# Patient Record
Sex: Male | Born: 1960 | Race: White | Hispanic: No | Marital: Married | Smoking: Never smoker
Health system: Southern US, Community
[De-identification: ages and names within clinical notes are randomized; demographics above are authoritative.]

## PROBLEM LIST (undated history)

## (undated) DIAGNOSIS — E119 Type 2 diabetes mellitus without complications: Secondary | ICD-10-CM

## (undated) DIAGNOSIS — I10 Essential (primary) hypertension: Secondary | ICD-10-CM

---

## 2018-09-10 ENCOUNTER — Other Ambulatory Visit: Payer: Self-pay

## 2018-09-10 ENCOUNTER — Encounter (HOSPITAL_BASED_OUTPATIENT_CLINIC_OR_DEPARTMENT_OTHER): Payer: Self-pay | Admitting: Emergency Medicine

## 2018-09-10 ENCOUNTER — Emergency Department (HOSPITAL_BASED_OUTPATIENT_CLINIC_OR_DEPARTMENT_OTHER): Payer: 59

## 2018-09-10 ENCOUNTER — Emergency Department (HOSPITAL_BASED_OUTPATIENT_CLINIC_OR_DEPARTMENT_OTHER)
Admission: EM | Admit: 2018-09-10 | Discharge: 2018-09-10 | Disposition: A | Discharge status: Other Acute Inpatient Hospital | Payer: 59 | Attending: Emergency Medicine | Admitting: Emergency Medicine

## 2018-09-10 DIAGNOSIS — I10 Essential (primary) hypertension: Secondary | ICD-10-CM | POA: Diagnosis not present

## 2018-09-10 DIAGNOSIS — R079 Chest pain, unspecified: Secondary | ICD-10-CM

## 2018-09-10 DIAGNOSIS — R0789 Other chest pain: Secondary | ICD-10-CM | POA: Diagnosis not present

## 2018-09-10 DIAGNOSIS — E119 Type 2 diabetes mellitus without complications: Secondary | ICD-10-CM | POA: Diagnosis not present

## 2018-09-10 HISTORY — DX: Type 2 diabetes mellitus without complications: E11.9

## 2018-09-10 HISTORY — DX: Essential (primary) hypertension: I10

## 2018-09-10 LAB — CBC WITH DIFFERENTIAL/PLATELET
ABS IMMATURE GRANULOCYTES: 0.02 10*3/uL (ref 0.00–0.07)
Basophils Absolute: 0 10*3/uL (ref 0.0–0.1)
Basophils Relative: 1 %
Eosinophils Absolute: 0.1 10*3/uL (ref 0.0–0.5)
Eosinophils Relative: 2 %
HEMATOCRIT: 43.1 % (ref 39.0–52.0)
HEMOGLOBIN: 14.7 g/dL (ref 13.0–17.0)
Immature Granulocytes: 0 %
Lymphocytes Relative: 26 %
Lymphs Abs: 1.6 10*3/uL (ref 0.7–4.0)
MCH: 31.5 pg (ref 26.0–34.0)
MCHC: 34.1 g/dL (ref 30.0–36.0)
MCV: 92.3 fL (ref 80.0–100.0)
Monocytes Absolute: 0.8 10*3/uL (ref 0.1–1.0)
Monocytes Relative: 13 %
NEUTROS ABS: 3.6 10*3/uL (ref 1.7–7.7)
Neutrophils Relative %: 58 %
Platelets: 233 10*3/uL (ref 150–400)
RBC: 4.67 MIL/uL (ref 4.22–5.81)
RDW: 11.5 % (ref 11.5–15.5)
WBC: 6.2 10*3/uL (ref 4.0–10.5)
nRBC: 0 % (ref 0.0–0.2)

## 2018-09-10 LAB — BASIC METABOLIC PANEL
Anion gap: 9 (ref 5–15)
BUN: 13 mg/dL (ref 6–20)
CO2: 24 mmol/L (ref 22–32)
Calcium: 9.5 mg/dL (ref 8.9–10.3)
Chloride: 103 mmol/L (ref 98–111)
Creatinine, Ser: 0.61 mg/dL (ref 0.61–1.24)
GFR calc Af Amer: >60 mL/min (ref >60)
GFR calc non Af Amer: >60 mL/min (ref >60)
Glucose, Bld: 161 mg/dL — ABNORMAL HIGH (ref 70–99)
POTASSIUM: 4.5 mmol/L (ref 3.5–5.1)
Sodium: 136 mmol/L (ref 135–145)

## 2018-09-10 LAB — TROPONIN I
Troponin I: <0.03 ng/mL (ref <0.03)
Troponin I: <0.03 ng/mL (ref <0.03)

## 2018-09-10 MED ORDER — ONDANSETRON HCL 4 MG/2ML IJ SOLN
INTRAMUSCULAR | Status: AC
  Filled 2018-09-10: qty 2

## 2018-09-10 MED ORDER — ONDANSETRON HCL 4 MG/2ML IJ SOLN
4.0000 mg | Freq: Once | INTRAMUSCULAR | Status: AC
  Administered 2018-09-10: 4 mg via INTRAVENOUS

## 2018-09-10 MED ORDER — ASPIRIN 81 MG PO CHEW
324.0000 mg | CHEWABLE_TABLET | Freq: Once | ORAL | Status: AC
  Administered 2018-09-10: 324 mg via ORAL
  Filled 2018-09-10: qty 4

## 2018-09-10 MED ORDER — HYDROMORPHONE HCL 1 MG/ML IJ SOLN
0.5000 mg | Freq: Once | INTRAMUSCULAR | Status: AC
  Administered 2018-09-10: 0.5 mg via INTRAVENOUS
  Filled 2018-09-10: qty 1

## 2018-09-10 NOTE — ED Provider Notes (Signed)
[facility] Emergency Department Provider Note MRN:  [id number]  Arrival date & time: 09/10/18     Chief Complaint   Chest Pain   History of Present Illness   Jonathon Jackson is a 58 y.o. year-old male with a history of hypertension, diabetes presenting to the ED with chief complaint of chest pain.  Left-sided chest pain that began suddenly at 4 AM, waking him from sleep.  The pain is nonradiating, the pain is 6 out of 10 in severity, described as a pressure-like pain.  Associated with paresthesias to the left hand, lightheadedness.  Denies nausea or vomiting, no shortness of breath.  Pain constant since that time.  Denies tobacco use.  Sister died of a heart attack at age 83.  Review of Systems  A complete 10 system review of systems was obtained and all systems are negative except as noted in the HPI and PMH.   Patient's Health History    Past Medical History:  Diagnosis Date  . Diabetes mellitus without complication (HCC)   . Hypertension     History reviewed. No pertinent surgical history.  History reviewed. No pertinent family history.  Social History   Socioeconomic History  . Marital status: Married    Spouse name: Not on file  . Number of children: Not on file  . Years of education: Not on file  . Highest education level: Not on file  Occupational History  . Not on file  Social Needs  . Financial resource strain: Not on file  . Food insecurity:    Worry: Not on file    Inability: Not on file  . Transportation needs:    Medical: Not on file    Non-medical: Not on file  Tobacco Use  . Smoking status: Never Smoker  . Smokeless tobacco: Never Used  Substance and Sexual Activity  . Alcohol use: Yes  . Drug use: Not Currently  . Sexual activity: Not on file  Lifestyle  . Physical activity:    Days per week: Not on file    Minutes per session: Not on file  . Stress: Not on file  Relationships  . Social connections:    Talks on  phone: Not on file    Gets together: Not on file    Attends religious service: Not on file    Active member of club or organization: Not on file    Attends meetings of clubs or organizations: Not on file    Relationship status: Not on file  . Intimate partner violence:    Fear of current or ex partner: Not on file    Emotionally abused: Not on file    Physically abused: Not on file    Forced sexual activity: Not on file  Other Topics Concern  . Not on file  Social History Narrative  . Not on file     Physical Exam  Vital Signs and Nursing Notes reviewed Vitals:   09/10/18 0800 09/10/18 0900  BP: (!) 148/84 131/78  Pulse: 64 62  Resp: 13 13  Temp:    SpO2: 96% 97%    CONSTITUTIONAL: Well-appearing, NAD NEURO:  Alert and oriented x 3, no focal deficits EYES:  eyes equal and reactive ENT/NECK:  no LAD, no JVD CARDIO: Regular rate, well-perfused, normal S1 and S2 PULM:  CTAB no wheezing or rhonchi GI/GU:  normal bowel sounds, non-distended, non-tender MSK/SPINE:  No gross deformities, no edema SKIN:  no rash, atraumatic PSYCH:  Appropriate speech  and behavior  Diagnostic and Interventional Summary    EKG Interpretation  Date/Time:  Thursday September 10 2018 06:36:27 EDT Ventricular Rate:  72 PR Interval:    QRS Duration: 103 QT Interval:  393 QTC Calculation: 431 R Axis:   -19 Text Interpretation:  Sinus rhythm Confirmed by Nicanor Alcon, April ([id number]) on 09/10/2018 6:40:43 AM      Labs Reviewed  BASIC METABOLIC PANEL - Abnormal; Notable for the following components:      Result Value   Glucose, Bld 161 (*)    All other components within normal limits  CBC WITH DIFFERENTIAL/PLATELET  TROPONIN I  TROPONIN I    DG Chest 2 View  Final Result      Medications  aspirin chewable tablet 324 mg (324 mg Oral Given 09/10/18 0708)  HYDROmorphone (DILAUDID) injection 0.5 mg (0.5 mg Intravenous Given 09/10/18 0710)  ondansetron (ZOFRAN) injection 4 mg (4 mg Intravenous Given  09/10/18 0388)     Procedures Critical Care  ED Course and Medical Decision Making  I have reviewed the triage vital signs and the nursing notes.  Pertinent labs & imaging results that were available during my care of the patient were reviewed by me and considered in my medical decision making (see below for details).  Concern for ACS in this 58 year old male with multiple risk factors including hypertension, diabetes, positive family history.  Patient story is typical, EKG is without significant concerns.  Troponin is pending.  Anticipating admission.  Clinical Course as of Sep 10 1110  Thu Sep 10, 2018  0707 Heart score 4, will need 2 troponins.   [MB]  [phone] Will admit to internal medicine if second troponin is negative, will admit to cardiology service if positive.   [MB]    Clinical Course User Index [MB] Sabas Sous, MD    Second troponin is negative.  Patient is appropriate for hospitalist admission.  Patient's preference is [facility].  Accepted by hospital service for direct admission, accepting physician Dr. Edman Circle.   Elmer Sow. Pilar Plate, MD [facility] [email or web address]  Final Clinical Impressions(s) / ED Diagnoses     ICD-10-CM   1. Chest pain, unspecified type R07.9     ED Discharge Orders    None         Sabas Sous, MD 09/10/18 1112

## 2018-09-10 NOTE — ED Triage Notes (Signed)
Patient presents with left sided chest pain; states onset 0400 this am; describes pain as a tightness; denies shortness of breath; denies nausea; ambulatory without difficulty with steady gait.

## 2018-09-10 NOTE — ED Notes (Signed)
ED Provider at bedside. 

## 2019-11-28 IMAGING — CR CHEST - 2 VIEW
2 series · 2 of 2 positions shown · non-contrast
Comparison: No recent prior.

CLINICAL DATA: Left-sided chest pain.

EXAM:
CHEST - 2 VIEW

[w chest pa]
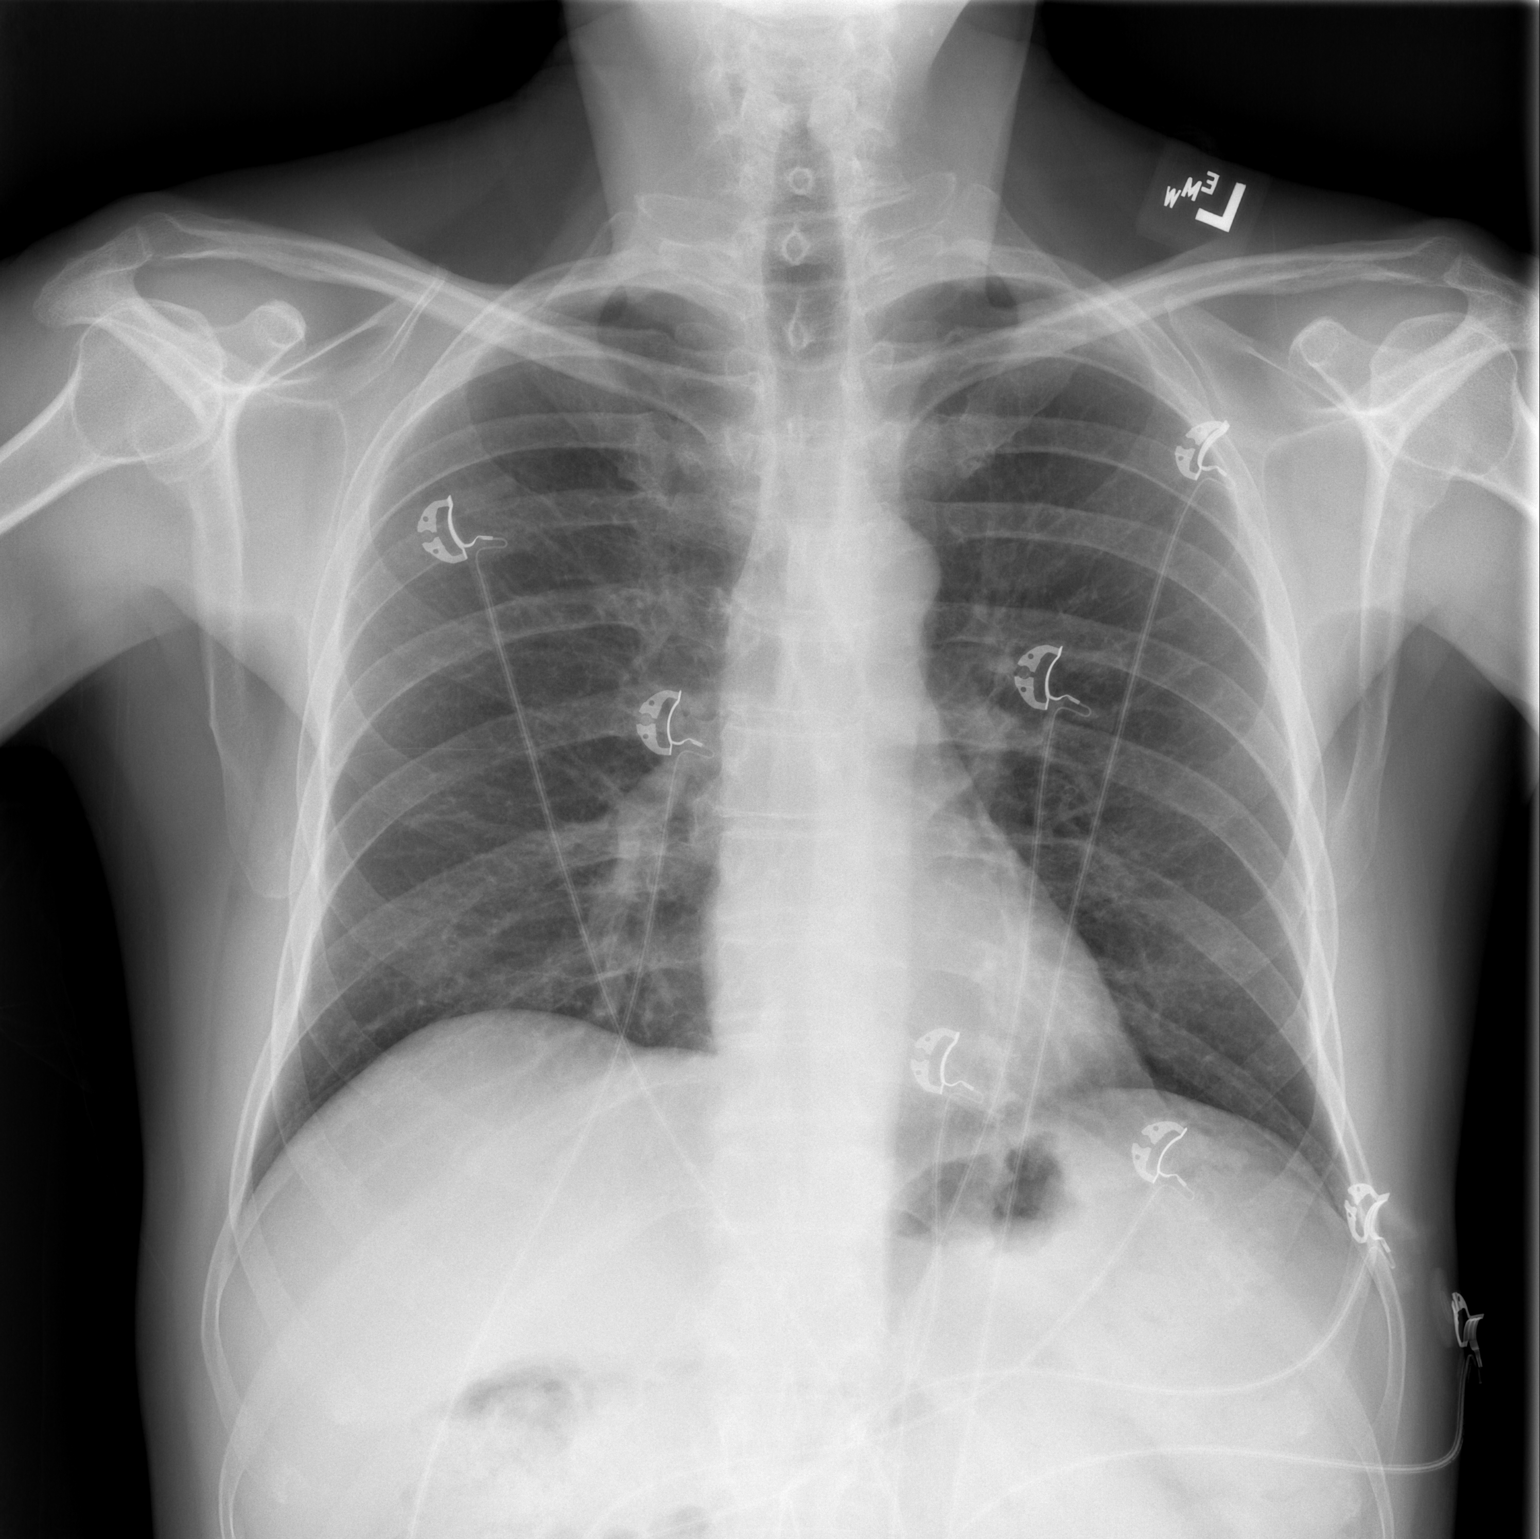

[w chest lat]
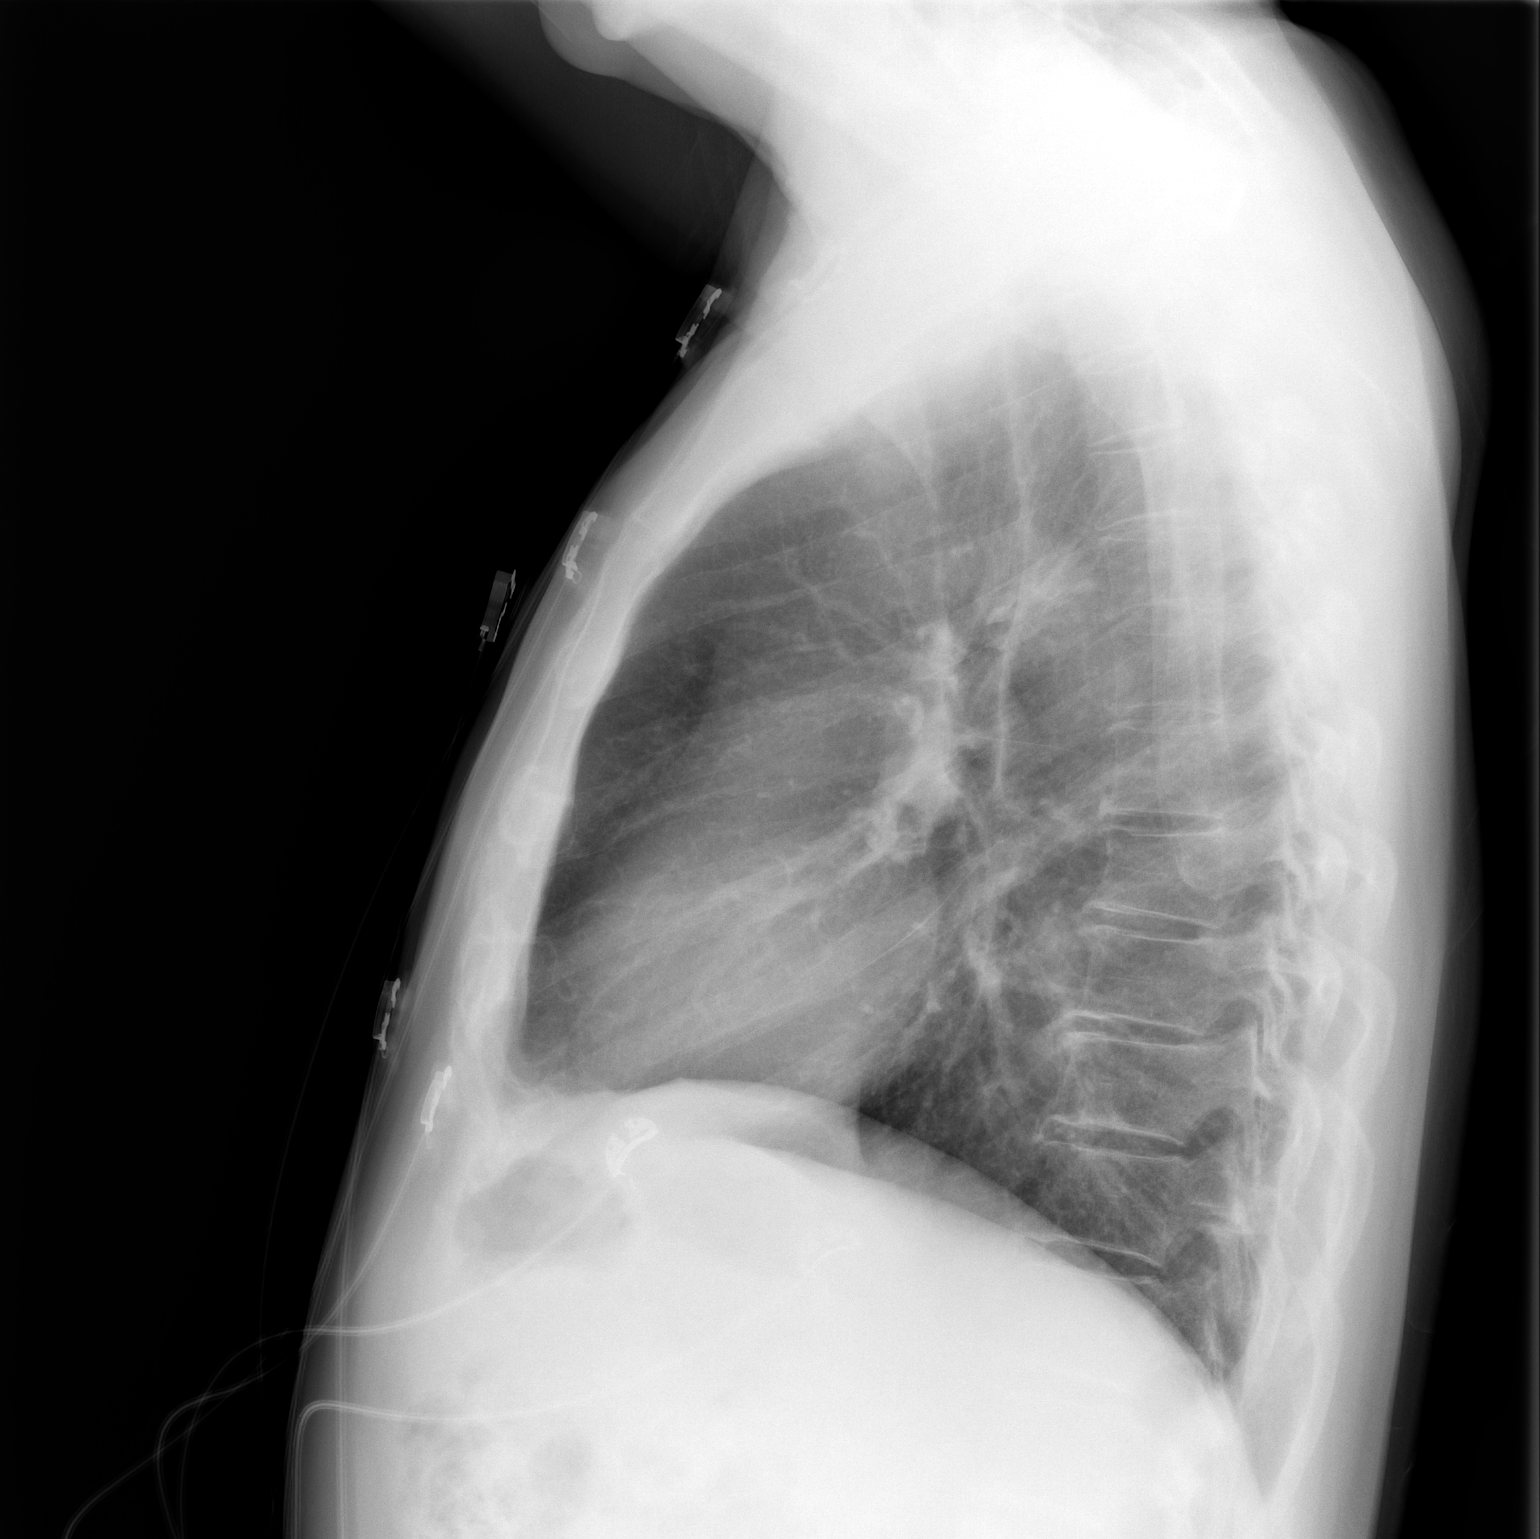

[2 of 2 positions shown; findings below may reference images not displayed]

FINDINGS: Mediastinum and hilar structures normal. Lungs are clear. No pleural
effusion pneumothorax. Heart size normal. No acute bony abnormality.
IMPRESSION: No acute cardiopulmonary disease.

## 2021-05-04 ENCOUNTER — Telehealth: Payer: Self-pay

## 2021-05-04 NOTE — Telephone Encounter (Signed)
Outbound call to patient spouse to schedule 2nd MPX vaccine. Spouse accepts appointment. For patient and himself.  Jonathon Jackson

## 2021-05-07 ENCOUNTER — Ambulatory Visit (INDEPENDENT_AMBULATORY_CARE_PROVIDER_SITE_OTHER): Payer: Self-pay

## 2021-05-07 ENCOUNTER — Other Ambulatory Visit: Payer: Self-pay

## 2021-05-07 DIAGNOSIS — Z23 Encounter for immunization: Secondary | ICD-10-CM

## 2021-05-07 NOTE — Progress Notes (Signed)
    [facility]  Name:  Jonathon Jackson    MRN: [id number] DOB: 1960/12/29   05/07/2021  [name] was observed post JYNNEOS immunization for 15 minutes without incident. He was provided with Vaccine Information Sheet and instruction to access the V-Safe system.   [name] was instructed to call 911 with any severe reactions post vaccine: Difficulty breathing  Swelling of face and throat  A fast heartbeat  A bad rash all over body  Dizziness and weakness    Juanita Laster, RMA
